# Patient Record
Sex: Male | Born: 1970 | Race: White | Hispanic: No | Marital: Married | Smoking: Never smoker
Health system: Southern US, Community
[De-identification: ages and names within clinical notes are randomized; demographics above are authoritative.]

## PROBLEM LIST (undated history)

## (undated) HISTORY — PX: APPENDECTOMY: SHX54

---

## 2008-11-15 ENCOUNTER — Emergency Department (HOSPITAL_BASED_OUTPATIENT_CLINIC_OR_DEPARTMENT_OTHER): Admission: EM | Admit: 2008-11-15 | Discharge: 2008-11-15 | Payer: Self-pay | Admitting: Emergency Medicine

## 2019-05-03 ENCOUNTER — Emergency Department (HOSPITAL_BASED_OUTPATIENT_CLINIC_OR_DEPARTMENT_OTHER)
Admission: EM | Admit: 2019-05-03 | Discharge: 2019-05-03 | Disposition: A | Discharge status: Home / Self Care | Payer: Managed Care, Other (non HMO) | Attending: Emergency Medicine | Admitting: Emergency Medicine

## 2019-05-03 ENCOUNTER — Other Ambulatory Visit: Payer: Self-pay

## 2019-05-03 ENCOUNTER — Encounter (HOSPITAL_BASED_OUTPATIENT_CLINIC_OR_DEPARTMENT_OTHER): Payer: Self-pay | Admitting: Emergency Medicine

## 2019-05-03 ENCOUNTER — Emergency Department (HOSPITAL_BASED_OUTPATIENT_CLINIC_OR_DEPARTMENT_OTHER): Payer: Managed Care, Other (non HMO)

## 2019-05-03 DIAGNOSIS — Y999 Unspecified external cause status: Secondary | ICD-10-CM | POA: Diagnosis not present

## 2019-05-03 DIAGNOSIS — T148XXA Other injury of unspecified body region, initial encounter: Secondary | ICD-10-CM

## 2019-05-03 DIAGNOSIS — Z79899 Other long term (current) drug therapy: Secondary | ICD-10-CM | POA: Diagnosis not present

## 2019-05-03 DIAGNOSIS — Y92312 Tennis court as the place of occurrence of the external cause: Secondary | ICD-10-CM | POA: Diagnosis not present

## 2019-05-03 DIAGNOSIS — Y9373 Activity, racquet and hand sports: Secondary | ICD-10-CM | POA: Insufficient documentation

## 2019-05-03 DIAGNOSIS — S76222A Laceration of adductor muscle, fascia and tendon of left thigh, initial encounter: Secondary | ICD-10-CM | POA: Diagnosis not present

## 2019-05-03 DIAGNOSIS — X509XXA Other and unspecified overexertion or strenuous movements or postures, initial encounter: Secondary | ICD-10-CM | POA: Insufficient documentation

## 2019-05-03 DIAGNOSIS — R1032 Left lower quadrant pain: Secondary | ICD-10-CM | POA: Diagnosis present

## 2019-05-03 MED ORDER — HYDROMORPHONE HCL 1 MG/ML IJ SOLN
1.0000 mg | Freq: Once | INTRAMUSCULAR | Status: AC
  Administered 2019-05-03: 1 mg via INTRAVENOUS
  Filled 2019-05-03: qty 1

## 2019-05-03 MED ORDER — TIZANIDINE HCL 4 MG PO CAPS: 4.0000 mg | ORAL_CAPSULE | Freq: Three times a day (TID) | ORAL | 0 refills | Status: AC

## 2019-05-03 MED ORDER — HYDROCODONE-ACETAMINOPHEN 5-325 MG PO TABS: 1.0000 | ORAL_TABLET | Freq: Four times a day (QID) | ORAL | 0 refills | Status: DC | PRN

## 2019-05-03 MED ORDER — TIZANIDINE HCL 4 MG PO CAPS: 4.0000 mg | ORAL_CAPSULE | Freq: Three times a day (TID) | ORAL | 0 refills | Status: DC

## 2019-05-03 MED ORDER — HYDROCODONE-ACETAMINOPHEN 5-325 MG PO TABS: 1.0000 | ORAL_TABLET | Freq: Four times a day (QID) | ORAL | 0 refills | Status: AC | PRN

## 2019-05-03 NOTE — ED Notes (Signed)
ED Provider at bedside. 

## 2019-05-03 NOTE — ED Provider Notes (Signed)
[facility] EMERGENCY DEPARTMENT Provider Note   CSN: [id number] Arrival date & time: 05/03/19  1211     History   Chief Complaint Chief Complaint  Patient presents with   Groin Pain    HPI Spencer Chambers is a 48 y.o. male.     Level 5 caveat for acuity of condition and pain.  Patient here with left groin pain after hurting himself while playing tennis.  States he was running after a ball and twisted the wrong way.  Did not fall or hit his head.  Complains of severe pain to his left groin and medial thigh.  He has a history of sciatica but this feels slightly different.  There is no numbness going down his leg.  There is some pain and weakness with attempted flexion of the hip. There is no pain with urination or blood in the urine.  Denies any abdominal pain or testicular pain.  He did not take any pain medication at home. No bowel or bladder incontinence.  No fever or vomiting.  No chest pain or shortness of breath.  The history is provided by the patient.  Groin Pain Pertinent negatives include no chest pain, no abdominal pain and no shortness of breath.    History reviewed. No pertinent past medical history.  There are no active problems to display for this patient.   Past Surgical History:  Procedure Laterality Date   APPENDECTOMY          Home Medications    Prior to Admission medications   Medication Sig Start Date End Date Taking? Authorizing Provider  traZODone (DESYREL) 150 MG tablet Take 150 mg by mouth at bedtime.   Yes [provider]    Family History History reviewed. No pertinent family history.  Social History Social History   Tobacco Use   Smoking status: Never Smoker   Smokeless tobacco: Never Used  Substance Use Topics   Alcohol use: Yes    Comment: occ   Drug use: Never     Allergies   Shellfish allergy   Review of Systems Review of Systems  HENT: Negative for congestion and rhinorrhea.     Respiratory: Positive for chest tightness. Negative for shortness of breath.   Cardiovascular: Negative for chest pain.  Gastrointestinal: Negative for abdominal pain, nausea and vomiting.  Genitourinary: Negative for dysuria, flank pain, hematuria, scrotal swelling, testicular pain and urgency.  Musculoskeletal: Positive for arthralgias and myalgias.   all other systems are negative except as noted in the HPI and PMH.     Physical Exam Updated Vital Signs BP (!) 168/98 (BP Location: Right Arm)    Pulse (!) 107    Temp 98.9 F (37.2 C) (Oral)    Resp 18    SpO2 96%   Physical Exam Vitals signs and nursing note reviewed.  Constitutional:      General: He is not in acute distress.    Appearance: He is well-developed.     Comments: Uncomfortable  HENT:     Head: Normocephalic and atraumatic.     Mouth/Throat:     Pharynx: No oropharyngeal exudate.  Eyes:     Conjunctiva/sclera: Conjunctivae normal.     Pupils: Pupils are equal, round, and reactive to light.  Neck:     Musculoskeletal: Normal range of motion and neck supple.     Comments: No meningismus. Cardiovascular:     Rate and Rhythm: Normal rate and regular rhythm.     Heart sounds: Normal heart  sounds. No murmur.  Pulmonary:     Effort: Pulmonary effort is normal. No respiratory distress.     Breath sounds: Normal breath sounds.  Abdominal:     Palpations: Abdomen is soft.     Tenderness: There is no abdominal tenderness. There is no guarding or rebound.  Genitourinary:    Comments: No testicular tenderness, no appreciable hernia.  There is tenderness and induration and spasm to the left medial thigh without fluctuance.  Intact ankle flexion and extension bilaterally.  Intact DP and PT pulses Musculoskeletal:        General: Deformity and signs of injury present. No tenderness.     Comments: Some weakness in flexing left hip. Pain with attempted movement of L hip. Firm induration across medial thigh that is very  tender. Compartments appear to be soft.  Skin:    General: Skin is warm.     Capillary Refill: Capillary refill takes less than 2 seconds.  Neurological:     General: No focal deficit present.     Mental Status: He is alert and oriented to person, place, and time. Mental status is at baseline.     Cranial Nerves: No cranial nerve deficit.     Motor: No abnormal muscle tone.     Coordination: Coordination normal.     Comments:  5/5 strength throughout. CN 2-12 intact.Equal grip strength.   Psychiatric:        Behavior: Behavior normal.      ED Treatments / Results  Labs (all labs ordered are listed, but only abnormal results are displayed) Labs Reviewed - No data to display  EKG None  Radiology Mr Hip Left Wo Contrast  Result Date: 05/03/2019 CLINICAL DATA:  Twisting injury playing tennis. Upper left thigh pain. EXAM: MR OF THE LEFT HIP WITHOUT CONTRAST TECHNIQUE: Multiplanar, multisequence MR imaging was performed. No intravenous contrast was administered. COMPARISON:  None. FINDINGS: Both hips are normally located. No hip fracture or AVN. No joint effusion. The pubic symphysis and SI joints are intact. No pelvic fractures or bone lesions. Extensive adductor muscle injuries. High-grade tearing of the pectineus and obturator externus muscles. The adductor longus tendon is completely torn from its attachment site and is retracted into the upper thigh. Diffuse edema in the muscle. The adductor brevis is also torn and may be partially stripped from its attachment site on the femur. Associated significant fluid/edema and hemorrhage extending down the medial aspect of the left thigh. No femur fracture. IMPRESSION: 1. 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 The adductor longus tendon is ruptured from its attachment site and is retracted into the upper thigh. 2. Probable partial  tearing of the adductor brevis muscle from its femoral attachment site. 3. The hips are intact and there is no pelvic or femur fractures identified. Electronically Signed   By: Marijo Sanes M.D.   On: 05/03/2019 14:53    Procedures Procedures (including critical care time)  Medications Ordered in ED Medications  HYDROmorphone (DILAUDID) injection 1 mg (1 mg Intravenous Given 05/03/19 1249)     Initial Impression / Assessment and Plan / ED Course  I have reviewed the triage vital signs and the nursing notes.  Pertinent labs & imaging results that were available during my care of the patient were reviewed by me and considered in my medical decision making (see chart for details).       Patient twisted while playing tennis and  suffered a groin injury.  He has difficulty flexing his left hip.  Distal pulses are intact.  No fall or direct trauma.  Concern for muscle strain or tear.  MRI is available and this shows torn muscles and tendons as above.  Adductor compartment injury  with multiple torn muscles and tendons.  Will discuss with orthopedics  Awaiting return phone call at shift change. Dr Jeraldine Loots to assume care . Final Clinical Impressions(s) / ED Diagnoses   Final diagnoses:  Muscle tear    ED Discharge Orders    None       Zahara Rembert, Jeannett Senior, MD 05/03/19 1659

## 2019-05-03 NOTE — ED Triage Notes (Signed)
Pt here after hurting left groin while playing tennis. States he went for a ball and twisted the wrong way and felt a tearing sensation in that area.

## 2019-05-03 NOTE — ED Notes (Signed)
Informed ED MD of pain 7/10

## 2019-05-03 NOTE — ED Notes (Signed)
Patient transported to MRI 

## 2019-05-03 NOTE — ED Provider Notes (Signed)
6:34 PM After a substantial delay in obtaining orthopedic consult, another orthopedic service, Dr. Ninfa Linden was happy to assist with evaluation of the patient's MRI results.  This was appreciated, and we discussed the patient's findings, which were subsequently made to the patient.  I with concern for multiple traumatic injuries to musculature, tendons, the patient will require outpatient follow-up, but does not require admission, nor emergent surgery. Patient amenable to this, amenable to discharge with anti-inflammatories, ice, muscle relaxants, outpatient follow-up.    Carmin Muskrat, MD 05/03/19 [phone]

## 2019-05-03 NOTE — Discharge Instructions (Addendum)
As discussed, today's evaluation has demonstrated injury to multiple muscles and tendon in your hip. The MRI interpretation is included below.  Our colleague who is a sports medicine specialist, is aware of your situation, and please be sure to schedule follow-up with his office this week. Monitor your condition carefully, and return here for concerning changes. For pain control, please use ibuprofen, 400 mg, 3 times daily, ice packs, and the prescribed muscle relaxers and pain medication for breakthrough discomfort.  Do not take the Norco and Zanaflex at the same time.

## 2019-05-05 ENCOUNTER — Ambulatory Visit (INDEPENDENT_AMBULATORY_CARE_PROVIDER_SITE_OTHER): Payer: Managed Care, Other (non HMO) | Admitting: Orthopedic Surgery

## 2019-05-05 ENCOUNTER — Other Ambulatory Visit: Payer: Self-pay

## 2019-05-05 DIAGNOSIS — S76202A Unspecified injury of adductor muscle, fascia and tendon of left thigh, initial encounter: Secondary | ICD-10-CM

## 2019-05-07 ENCOUNTER — Encounter: Payer: Self-pay | Admitting: Orthopedic Surgery

## 2019-05-07 NOTE — Progress Notes (Signed)
   Office Visit Note   Patient: Spencer Chambers           Date of Birth: 08/19/1970           MRN: [id number] Visit Date: 05/05/2019 Requested by: [facility] [place],  [place] [id number] PCP: [facility]  Subjective: Chief Complaint  Patient presents with  . 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  He was playing tennis and he felt a tearing sensation in the medial aspect of his left hip.  He reports groin and medial thigh pain.  Describes bruising and swelling.  Painful for him to lift his leg.  He has been taking ibuprofen and using ice.  He has had an MRI scan of that left hip which is reviewed.  It does show tearing of the abductor longus and part of the pectineus and obturator.  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              ROS: All systems reviewed are negative as they relate to the chief complaint within the history of present illness.  Patient denies  fevers or chills.   Assessment & Plan: Visit Diagnoses:  1. Injury of adductor muscle and tendon of left thigh, initial encounter     Plan: Impression is abductor tearing with some weakness in adduction.  Reviewed the literature on this and and in general nonoperative treatment is the way to go.  I will start him in some physical therapy 2 times a week for 4 weeks for modalities and rehab.  Sedentary duty for 2 weeks and then okay to return to supervisory work.  4-week return for clinical recheck.  Follow-Up Instructions: Return in about 4 weeks (around 06/02/2019).   Orders:  Orders Placed This Encounter  Procedures  . Ambulatory referral to Physical Therapy   No orders of the defined types were placed in this encounter.     Procedures: No procedures performed   Clinical Data: No additional findings.  Objective: Vital Signs: There were no vitals taken for this visit.  Physical Exam:   Constitutional:  Patient appears well-developed HEENT:  Head: Normocephalic Eyes:EOM are normal Neck: Normal range of motion Cardiovascular: Normal rate Pulmonary/chest: Effort normal Neurologic: Patient is alert Skin: Skin is warm Psychiatric: Patient has normal mood and affect    Ortho Exam: Ortho exam demonstrates some bruising and ecchymosis along the medial aspect of the left thigh.  Mild groin pain with internal extra rotation of the leg but good hip abduction strength and weakness in adduction on the left.  He also has pretty good hip flexion strength bilaterally.  Specialty Comments:  No specialty comments available.  Imaging: No results found.   PMFS History: There are no active problems to display for this patient.  History reviewed. No pertinent past medical history.  History reviewed. No pertinent family history.  Past Surgical History:  Procedure Laterality Date  . APPENDECTOMY     Social History   Occupational History  . Not on file  Tobacco Use  . Smoking status: Never Smoker  . Smokeless tobacco: Never Used  Substance and Sexual Activity  . Alcohol use: Yes    Comment: occ  . Drug use: Never  . Sexual activity: Yes

## 2019-05-19 ENCOUNTER — Other Ambulatory Visit: Payer: Self-pay

## 2019-05-19 ENCOUNTER — Ambulatory Visit: Payer: Managed Care, Other (non HMO) | Attending: Orthopedic Surgery | Admitting: Physical Therapy

## 2019-05-19 ENCOUNTER — Ambulatory Visit: Payer: Managed Care, Other (non HMO) | Admitting: Physical Therapy

## 2019-05-19 DIAGNOSIS — M62838 Other muscle spasm: Secondary | ICD-10-CM

## 2019-05-19 DIAGNOSIS — R262 Difficulty in walking, not elsewhere classified: Secondary | ICD-10-CM | POA: Diagnosis present

## 2019-05-19 DIAGNOSIS — M6281 Muscle weakness (generalized): Secondary | ICD-10-CM | POA: Diagnosis present

## 2019-05-19 DIAGNOSIS — M79652 Pain in left thigh: Secondary | ICD-10-CM

## 2019-05-19 NOTE — Therapy (Signed)
[facility] [address]  [address] [place], [place], [address] Phone: [phone]   Fax:  [phone]  Physical Therapy Evaluation  Patient Details  Name: Spencer Chambers MRN: [id number] Date of Birth: Jun 25, 1970 Referring Provider (PT): Burnard Bunting, MD   Encounter Date: 05/19/2019  PT End of Session - 05/19/19 0926    Visit Number  1    Number of Visits  8    Date for PT Re-Evaluation  06/16/19    Authorization Type  [employer] - VL: 20  ($60 copay)    PT Start Time  0926    PT Stop Time  1018    PT Time Calculation (min)  52 min    Activity Tolerance  Patient tolerated treatment well    Behavior During Therapy  [facility] for tasks assessed/performed       No past medical history on file.  Past Surgical History:  Procedure Laterality Date  . APPENDECTOMY      There were no vitals filed for this visit.   Subjective Assessment - 05/19/19 0930    Subjective  16 days ago was playing tennis with son when he felt a ripping sound in his L groin. MRI revealed multiple tears in L hip adductor muscles.    Limitations  Standing;Walking    How long can you stand comfortably?  5 minutes    How long can you walk comfortably?  <20 minutes    Diagnostic tests  L hip MRI 05/03/19: 1. 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 The adductor longus tendon is ruptured from its attachment site and is retracted into the upper thigh.  2. Probable partial tearing of the adductor brevis muscle from its femoral attachment site.  3. The hips are intact and there is no pelvic or femur fractures identified.    Patient Stated Goals  "get ROM back w/o pain"    Currently in Pain?  Yes    Pain Score  3     Pain Location  Groin    Pain Orientation  Left    Pain Descriptors / Indicators  Sharp;Dull    Pain Type  Acute pain    Pain Radiating  Towards  L medial thigh    Pain Onset  1 to 4 weeks ago    Pain Frequency  Constant    Aggravating Factors   supine to sit, trunk extension    Pain Relieving Factors  ice, ibuprofen    Effect of Pain on Daily Activities  pain with transitions in/out of bed         [facility] PT Assessment - 05/19/19 0926      Assessment   Medical Diagnosis  L hip adductor injury    Referring Provider (PT)  G. Dorene Grebe, MD    Onset Date/Surgical Date  05/03/19    Next MD Visit  06/04/19    Prior Therapy  none      Precautions   Precautions  None      Balance Screen   Has the patient fallen in the past 6 months  No    Has the patient had a decrease in activity level because of a fear of falling?   No    Is the patient reluctant to leave their home because of a fear of falling?   No      Home Environment   Living Environment  Private residence    Type of [place] to enter    Entrance Stairs-Number of Steps  2-3    [place]  One level      Prior Function   Level of Independence  Independent    Vocation  Full time employment    Stage manager    Leisure  tennis, yardwork, running, hiking      Cognition   Overall Cognitive Status  Within Functional Limits for tasks assessed      Observation/Other Assessments   Focus on Therapeutic Outcomes (FOTO)   Upper leg - 56% (44% limitation); Predicted 77% (23% limitation)      ROM / Strength   AROM / PROM / Strength  AROM;PROM;Strength      AROM   Overall AROM   Deficits;Due to pain    Overall AROM Comments  mildly limited L hip AROM      PROM   Overall PROM   Deficits;Due to pain   & tightness   Overall PROM Comments  L hip WFL with mild restrictions in abduction and ER/IR      Strength   Strength Assessment Site  Hip;Knee    Right/Left Hip  Right;Left    Right Hip Flexion  5/5    Right Hip Extension  5/5    Right Hip External Rotation   5/5    Right Hip Internal Rotation  5/5    Right  Hip ABduction  5/5    Right Hip ADduction  5/5    Left Hip Flexion  4/5   mild pain   Left Hip Extension  4+/5    Left Hip External Rotation  4/5    Left Hip Internal Rotation  4/5   mild pain   Left Hip ABduction  4+/5    Left Hip ADduction  3/5    Right/Left Knee  Right;Left    Right Knee Flexion  5/5    Right Knee Extension  5/5    Left Knee Flexion  4/5    Left Knee Extension  4/5      Flexibility   Soft Tissue Assessment /Muscle Length  yes    Hamstrings  mild tight L>R    Quadriceps  mild tight L>R    ITB  mild tight L>R    Piriformis  mild/mod tight L>R                Objective measurements completed on examination: See above findings.      [facility] PT Treatment/Exercise - 05/19/19 0926      Exercises   Exercises  Knee/Hip      Knee/Hip Exercises: Stretches   Passive Hamstring Stretch  Left;30 seconds;1 rep    Passive Hamstring Stretch Limitations  supine with strap     Piriformis Stretch  Left;30 seconds;1 rep    Piriformis Stretch Limitations  KTOS     Other Knee/Hip Stretches  L SKTC stretch x 30 sec    Other Knee/Hip Stretches  L hip adductor stretches - supine butterfly, supine with strap & seated lateral lunge position x 30 sec each - pt preferring latter 2 stretches             PT Education - 05/19/19 1018    Education Details  PT eval findings, anticipated POC & initial HEP    Person(s) Educated  Patient    Methods  Explanation;Demonstration;Handout    Comprehension  Verbalized  understanding;Returned demonstration;Need further instruction          PT Long Term Goals - 05/19/19 1018      PT LONG TERM GOAL #1   Title  Patient will be independent with ongoing/advanced HEP    Status  New    Target Date  06/16/19      PT LONG TERM GOAL #2   Title  L hip AROM WNL w/o pain provocation    Status  New    Target Date  06/16/19      PT LONG TERM GOAL #3   Title  Overall L hip strength >/= 4+/5 with adductor strength at least  4/5 for improved stability    Status  New    Target Date  06/16/19      PT LONG TERM GOAL #4   Title  Patient to report ability to perform ADLs, household and work-related tasks without increased pain    Status  New    Target Date  06/16/19             Plan - 05/19/19 1018    Clinical Impression Statement  Spencer Chambers is a 48 y/o male who presents to OP PT for a L hip adductor injury sustained while playing tennis with his son on 05/03/19. Extensive bruising still present throughout L LE along medial thigh, posterior knee and into lower leg. MRI revealing extensive adductor compartment muscle injury with high-grade tearing of the pectineus, obturator externus, adductor longus (tendon ruptured from its attachment site and is retracted into the upper thigh) and adductor brevis muscles (probable partial tearing of the adductor brevis muscle from its femoral attachment site) although no pelvic or femur fractures identified. Assessment revealing mild limitations in L hip ROM and B proximal LE flexibility with increased muscle tension and ttp throughout L hip adductors, flexors and deep hip rotators. Mild decreased strength noted in L hip and knee with more pronounce weakness in L hip adductors. Spencer Chambers will benefit from skilled PT to address above deficits to allow for increased ease of walking with decreased limitation due to R hip pain.    Personal Factors and Comorbidities  Profession    Examination-Activity Limitations  Bed Mobility;Bend;Lift;Locomotion Level;Squat;Stand;Transfers    Examination-Participation Restrictions  Community Activity    Stability/Clinical Decision Making  Stable/Uncomplicated    Clinical Decision Making  Low    Rehab Potential  Good    PT Frequency  2x / week    PT Duration  4 weeks    PT Treatment/Interventions  ADLs/Self Care Home Management;Cryotherapy;Electrical Stimulation;Iontophoresis 4mg /ml Dexamethasone;Moist Heat;Ultrasound;Gait training;Stair training;Functional  mobility training;Therapeutic activities;Therapeutic exercise;Balance training;Neuromuscular re-education;Patient/family education;Manual techniques;Scar mobilization;Passive range of motion;Dry needling;Taping;Vasopneumatic Device    PT Next Visit Plan  Review initial HEP; gentle hip stretching and strengthening; manual therapy as indicated; modalities PRN    PT Home Exercise Plan  05/19/19 - HS, ITB, piriformis, SKTC & hip adductor stretches    Consulted and Agree with Plan of Care  Patient       Patient will benefit from skilled therapeutic intervention in order to improve the following deficits and impairments:  Abnormal gait, Decreased activity tolerance, Decreased endurance, Decreased mobility, Decreased range of motion, Decreased strength, Difficulty walking, Increased edema, Increased muscle spasms, Impaired perceived functional ability, Impaired flexibility, Pain  Visit Diagnosis: Pain in left thigh  Muscle weakness (generalized)  Other muscle spasm  Difficulty in walking, not elsewhere classified     Problem List There are no active problems to display for this patient.  Marry Guan, PT, MPT 05/19/2019, 1:51 PM  [facility] [address]  [address] [place], [place], [address] Phone: [phone]   Fax:  [phone]  Name: Spencer Chambers MRN: [id number] Date of Birth: February 14, 1971

## 2019-05-19 NOTE — Patient Instructions (Signed)
    Home exercise program created by Deago Burruss, PT.  For questions, please contact Dessirae Scarola via phone at [phone] or email at [email or web address]  [facility] [address]  [address] [place], [place], [address] Phone: [phone]   Fax:  [phone]    

## 2019-05-21 ENCOUNTER — Ambulatory Visit: Payer: Managed Care, Other (non HMO) | Attending: Orthopedic Surgery | Admitting: Physical Therapy

## 2019-05-21 ENCOUNTER — Encounter: Payer: Self-pay | Admitting: Physical Therapy

## 2019-05-21 ENCOUNTER — Other Ambulatory Visit: Payer: Self-pay

## 2019-05-21 DIAGNOSIS — M6281 Muscle weakness (generalized): Secondary | ICD-10-CM | POA: Insufficient documentation

## 2019-05-21 DIAGNOSIS — M62838 Other muscle spasm: Secondary | ICD-10-CM | POA: Insufficient documentation

## 2019-05-21 DIAGNOSIS — M79652 Pain in left thigh: Secondary | ICD-10-CM | POA: Diagnosis present

## 2019-05-21 DIAGNOSIS — R262 Difficulty in walking, not elsewhere classified: Secondary | ICD-10-CM | POA: Diagnosis present

## 2019-05-21 NOTE — Therapy (Signed)
[facility] [address]  [address] [place], [place], [address] Phone: [phone]   Fax:  [phone]  Physical Therapy Treatment  Patient Details  Name: Spencer Chambers MRN: [id number] Date of Birth: 07-07-1970 Referring Provider (PT): Burnard Bunting, MD   Encounter Date: 05/21/2019  PT End of Session - 05/21/19 0928    Visit Number  2    Number of Visits  8    Date for PT Re-Evaluation  06/16/19    Authorization Type  [employer] - VL: 20  ($60 copay)    PT Start Time  0928    PT Stop Time  1013    PT Time Calculation (min)  45 min    Activity Tolerance  Patient tolerated treatment well    Behavior During Therapy  [facility] for tasks assessed/performed       History reviewed. No pertinent past medical history.  Past Surgical History:  Procedure Laterality Date  . APPENDECTOMY      There were no vitals filed for this visit.  Subjective Assessment - 05/21/19 0932    Subjective  Pt reporting good tolerance for home stretches with pain now lessening and no pain with normal walking.    Limitations  Standing;Walking    How long can you stand comfortably?  5 minutes    How long can you walk comfortably?  <20 minutes    Diagnostic tests  L hip MRI 05/03/19: 1. 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 The adductor longus tendon is ruptured from its attachment site and is retracted into the upper thigh.  2. Probable partial tearing of the adductor brevis muscle from its femoral attachment site.  3. The hips are intact and there is no pelvic or femur fractures identified.    Patient Stated Goals  "get ROM back w/o pain"    Currently in Pain?  No/denies    Pain Score  0-No pain    Pain Location  Groin    Pain Orientation  Left    Pain Frequency  Intermittent                       [facility] Adult PT  Treatment/Exercise - 05/21/19 0928      Exercises   Exercises  Knee/Hip      Knee/Hip Exercises: Stretches   Gastroc Stretch  Left;30 seconds;2 reps    Gastroc Stretch Limitations  runner's stretch at wall & negative heel off edge of step - pt reporting better stretch with latter    Soleus Stretch  Left;30 seconds;1 rep    Soleus Stretch Limitations  runner's stretch at wall       Knee/Hip Exercises: Aerobic   Recumbent Bike  L1 x 6 min      Knee/Hip Exercises: Supine   Hip Adduction Isometric  Both;10 reps;Strengthening;2 sets    Hip Adduction Isometric Limitations  gentle ball squeeze    Bridges  Both;10 reps;Strengthening   5" hold   Straight Leg Raises  Left;10 reps;Strengthening   3" hold   Straight Leg Raises Limitations  leg starting from slight hip flexion (lower leg resting on bolster) due to increased pain with initiation from neutral     Other Supine Knee/Hip Exercises  Hooklying L knee fall-out x 10 avoiding painful ROM      Manual Therapy   Manual Therapy  Soft tissue mobilization;Myofascial release;Other (comment)  Manual therapy comments  slight hooklying with L LE slightly ER and resting on PT's knee    Soft tissue mobilization  STM/DTM to L hip adductors, distal iliopsoas and proximal iliacus - very ttp over proximal L hip adductors    Myofascial Release  manual TPR and pin & stretch to L proximal hip adductors - better tolerance for exercise following this    Other Manual Therapy  Provided instruction in self-release/STM for L hip adductors and flexors as needed at home.             PT Education - 05/21/19 1013    Education Details  HEP update - gentle hip adductor isometric, bent knee fall-out, bridge + hip adduction isometric; instruction in self-STM/release for L hip adductors & flexors    Person(s) Educated  Patient    Methods  Explanation;Demonstration;Handout    Comprehension  Verbalized understanding;Returned demonstration;Need further instruction           PT Long Term Goals - 05/21/19 0935      PT LONG TERM GOAL #1   Title  Patient will be independent with ongoing/advanced HEP    Status  On-going    Target Date  06/16/19      PT LONG TERM GOAL #2   Title  L hip AROM WNL w/o pain provocation    Status  On-going    Target Date  06/16/19      PT LONG TERM GOAL #3   Title  Overall L hip strength >/= 4+/5 with adductor strength at least 4/5 for improved stability    Status  On-going    Target Date  06/16/19      PT LONG TERM GOAL #4   Title  Patient to report ability to perform ADLs, household and work-related tasks without increased pain    Status  On-going    Target Date  06/16/19            Plan - 05/21/19 0936    Clinical Impression Statement  Spencer Chambers reporting good tolerance for home stretches with decreasing pain and improving walking tolerance - greatest benefit from adductor stretches. He denies need for review of HEP today, therefore introduced gentle hip abd/adduction ROM and isometric strengthening. Tolerance initially limited with increased pain reported in L hip adductors, however improved after STM/DTM, MFR and manual TPR. Difficulty initiating SLR from neutral hip flexion/extension, but better tolerated when initiated from slightly elevation in flexion with foot resting on bolster. HEP updated to include gentle ROM and isometric strengthening with patient cautioned to work within pain tolerance. Spencer Chambers declining need for ice for pain at end of session and will use ice and/or heat PRN at home.    Personal Factors and Comorbidities  Profession    Examination-Activity Limitations  Bed Mobility;Bend;Lift;Locomotion Level;Squat;Stand;Transfers    Examination-Participation Restrictions  Community Activity    Rehab Potential  Good    PT Frequency  2x / week    PT Duration  4 weeks    PT Treatment/Interventions  ADLs/Self Care Home Management;Cryotherapy;Electrical Stimulation;Iontophoresis 4mg /ml  Dexamethasone;Moist Heat;Ultrasound;Gait training;Stair training;Functional mobility training;Therapeutic activities;Therapeutic exercise;Balance training;Neuromuscular re-education;Patient/family education;Manual techniques;Scar mobilization;Passive range of motion;Dry needling;Taping;Vasopneumatic Device    PT Next Visit Plan  review HEP(s) as indicated; gentle hip stretching and strengthening; manual therapy as indicated; modalities PRN    PT Home Exercise Plan  05/19/19 - HS, ITB, piriformis, SKTC & hip adductor stretches; 05/21/19 - gentle hip adductor isometric, bent knee fall-out & bridge + hip adduction isometric, instruction in  self-STM/release for L hip adductors & flexors    Consulted and Agree with Plan of Care  Patient       Patient will benefit from skilled therapeutic intervention in order to improve the following deficits and impairments:  Abnormal gait, Decreased activity tolerance, Decreased endurance, Decreased mobility, Decreased range of motion, Decreased strength, Difficulty walking, Increased edema, Increased muscle spasms, Impaired perceived functional ability, Impaired flexibility, Pain  Visit Diagnosis: Pain in left thigh  Muscle weakness (generalized)  Other muscle spasm  Difficulty in walking, not elsewhere classified     Problem List There are no active problems to display for this patient.   Marry GuanJoAnne M Kreis, PT, MPT 05/21/2019, 11:40 AM  [facility] [address]  [address] [place], [place], [address] Phone: [phone]   Fax:  [phone]  Name: Janyce Llanoseter Driggers MRN: [id number] Date of Birth: [birth year]

## 2019-05-26 ENCOUNTER — Ambulatory Visit: Payer: Managed Care, Other (non HMO)

## 2019-05-26 ENCOUNTER — Other Ambulatory Visit: Payer: Self-pay

## 2019-05-26 DIAGNOSIS — M79652 Pain in left thigh: Secondary | ICD-10-CM

## 2019-05-26 DIAGNOSIS — M6281 Muscle weakness (generalized): Secondary | ICD-10-CM

## 2019-05-26 DIAGNOSIS — M62838 Other muscle spasm: Secondary | ICD-10-CM

## 2019-05-26 DIAGNOSIS — R262 Difficulty in walking, not elsewhere classified: Secondary | ICD-10-CM

## 2019-05-26 NOTE — Therapy (Signed)
[facility] [address]  [address] [place], [place], [address] Phone: [phone]   Fax:  [phone]  Physical Therapy Treatment  Patient Details  Name: Spencer Chambers Isidore MRN: [id number] Date of Birth: 1971-04-02 Referring Provider (PT): Burnard BuntingG. Scott Dean, MD   Encounter Date: 05/26/2019  PT End of Session - 05/26/19 1032    Visit Number  3    Number of Visits  8    Date for PT Re-Evaluation  06/16/19    Authorization Type  [employer] - VL: 20  ($60 copay)    PT Start Time  1023    PT Stop Time  1102    PT Time Calculation (min)  39 min    Activity Tolerance  Patient tolerated treatment well    Behavior During Therapy  [facility] for [facility] assessed/performed       No past medical history on file.  Past Surgical History:  Procedure Laterality Date  . APPENDECTOMY      There were no vitals filed for this visit.  Subjective Assessment - 05/26/19 1031    Subjective  Pt. reporting he has been using heat pad at home with some relief.    Diagnostic tests  L hip MRI 05/03/19: 1. 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 The adductor longus tendon is ruptured from its attachment site and is retracted into the upper thigh.  2. Probable partial tearing of the adductor brevis muscle from its femoral attachment site.  3. The hips are intact and there is no pelvic or femur fractures identified.    Patient Stated Goals  "get ROM back w/o pain"    Currently in Pain?  No/denies    Pain Score  0-No pain   3/10 at worst   Pain Orientation  Left    Pain Descriptors / Indicators  Dull    Pain Type  Acute pain    Pain Onset  1 to 4 weeks ago    Pain Frequency  Intermittent    Multiple Pain Sites  No                       [facility] Adult PT Treatment/Exercise - 05/26/19 0001      Knee/Hip Exercises: Stretches   Passive  Hamstring Stretch  Left;30 seconds;1 rep    Passive Hamstring Stretch Limitations  supine with strap     Hip Flexor Stretch  Left;30 seconds;1 rep    Hip Flexor Stretch Limitations  mod thomas position + mild groin stretch     Piriformis Stretch  Left;30 seconds;2 reps    Piriformis Stretch Limitations  KTOS;  figure-4 stretch     Other Knee/Hip Stretches  L adductor stretch in varying positions of hip flexion/ext, and L knee bent and extended x 20 sec each way       Knee/Hip Exercises: Aerobic   Recumbent Bike  L1 x 7 min      Knee/Hip Exercises: Supine   Hip Adduction Isometric  Both;15 reps;1 set;Strengthening    Hip Adduction Isometric Limitations  gentle ball squeeze    Bridges  Both;15 reps    Other Supine Knee/Hip Exercises  Hooklying L knee fall-out x 15 avoiding painful ROM      Manual Therapy   Manual Therapy  Soft tissue mobilization;Myofascial release    Manual therapy comments  slight hooklying     Soft tissue mobilization  STM/DTM to L hip adductors, distal iliopsoas - min tenderness at most in mid adductor group    Myofascial Release  manual TPR and pin & stretch to L proximal hip adductors - better tolerance for exercise following this                  PT Long Term Goals - 05/21/19 0935      PT LONG TERM GOAL #1   Title  Patient will be independent with ongoing/advanced HEP    Status  On-going    Target Date  06/16/19      PT LONG TERM GOAL #2   Title  L hip AROM WNL w/o pain provocation    Status  On-going    Target Date  06/16/19      PT LONG TERM GOAL #3   Title  Overall L hip strength >/= 4+/5 with adductor strength at least 4/5 for improved stability    Status  On-going    Target Date  06/16/19      PT LONG TERM GOAL #4   Title  Patient to report ability to perform ADLs, household and work-related tasks without increased pain    Status  On-going    Target Date  06/16/19            Plan - 05/26/19 1041    Clinical Impression  Statement  Saifan doing well.  Denies soreness after last session.  Has been performing updated HEP without pain.  Able to demo good performance of updated HEP with cueing x 1 for full hip extension with bridge/adduction.  STM/DTM addressing palpable tightness in L mid/proximal adductor group with pt. tolerating well.  Able to progress repetitions with gentle proximal hip strengthening activities without increased pain today.  Pt. denied significant ttp in L adductor group today with MT.  Pt. inquiring about starting to jog and encourage to hold off on this activity for time being as to not over strain L adductors.  Pt. verbalized understanding.    Personal Factors and Comorbidities  Profession    Rehab Potential  Good    PT Treatment/Interventions  ADLs/Self Care Home Management;Cryotherapy;Electrical Stimulation;Iontophoresis 4mg /ml Dexamethasone;Moist Heat;Ultrasound;Gait training;Stair training;Functional mobility training;Therapeutic activities;Therapeutic exercise;Balance training;Neuromuscular re-education;Patient/family education;Manual techniques;Scar mobilization;Passive range of motion;Dry needling;Taping;Vasopneumatic Device    PT Next Visit Plan  review HEP(s) as indicated; gentle hip stretching and strengthening; manual therapy as indicated; modalities PRN    PT Home Exercise Plan  05/19/19 - HS, ITB, piriformis, SKTC & hip adductor stretches; 05/21/19 - gentle hip adductor isometric, bent knee fall-out & bridge + hip adduction isometric, instruction in self-STM/release for L hip adductors & flexors    Consulted and Agree with Plan of Care  Patient       Patient will benefit from skilled therapeutic intervention in order to improve the following deficits and impairments:  Abnormal gait, Decreased activity tolerance, Decreased endurance, Decreased mobility, Decreased range of motion, Decreased strength, Difficulty walking, Increased edema, Increased muscle spasms, Impaired perceived functional  ability, Impaired flexibility, Pain  Visit Diagnosis: Pain in left thigh  Muscle weakness (generalized)  Other muscle spasm  Difficulty in walking, not elsewhere classified     Problem List There are no active problems to display for this patient.   14/2/20, PTA 05/26/19 12:18 PM   [facility] [address]  [address] [place], [place], [place] Phone: [phone]   Fax:  [phone]  Name: Jay Haskew MRN: Spencer Llanos Date  of Birth: 20-Oct-1970

## 2019-05-29 ENCOUNTER — Other Ambulatory Visit: Payer: Self-pay

## 2019-05-29 ENCOUNTER — Ambulatory Visit: Payer: Managed Care, Other (non HMO)

## 2019-05-29 DIAGNOSIS — M6281 Muscle weakness (generalized): Secondary | ICD-10-CM

## 2019-05-29 DIAGNOSIS — R262 Difficulty in walking, not elsewhere classified: Secondary | ICD-10-CM

## 2019-05-29 DIAGNOSIS — M79652 Pain in left thigh: Secondary | ICD-10-CM | POA: Diagnosis not present

## 2019-05-29 DIAGNOSIS — M62838 Other muscle spasm: Secondary | ICD-10-CM

## 2019-05-29 NOTE — Therapy (Signed)
[facility] [place] [facility] [address]  [place], [place], [address] Phone: [phone]   Fax:  [phone]  Physical Therapy Treatment  Patient Details  Name: Spencer Chambers MRN: [id number] Date of Birth: [birth year] Referring Provider (PT): Anderson Malta, MD   Encounter Date: 05/29/2019  PT End of Session - 05/29/19 0937    Visit Number  4    Number of Visits  8    Date for PT Re-Evaluation  06/16/19    Authorization Type  [employer] - VL: 20  ($60 copay)    PT Start Time  0932    PT Stop Time  1012    PT Time Calculation (min)  40 min    Activity Tolerance  Patient tolerated treatment well    Behavior During Therapy  [facility] for tasks assessed/performed       History reviewed. No pertinent past medical history.  Past Surgical History:  Procedure Laterality Date  . APPENDECTOMY      There were no vitals filed for this visit.  Subjective Assessment - 05/29/19 0937    Subjective  Pt. denies soreness after last visit.    Diagnostic tests  L hip MRI 05/03/19: 1. 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 The adductor longus tendon is ruptured from its attachment site and is retracted into the upper thigh.  2. Probable partial tearing of the adductor brevis muscle from its femoral attachment site.  3. The hips are intact and there is no pelvic or femur fractures identified.    Patient Stated Goals  "get ROM back w/o pain"    Currently in Pain?  No/denies    Pain Score  0-No pain    Multiple Pain Sites  No                       [facility] Adult PT Treatment/Exercise - 05/29/19 0001      Knee/Hip Exercises: Stretches   Passive Hamstring Stretch  Left;30 seconds;1 rep    Passive Hamstring Stretch Limitations  supine with strap     Hip Flexor Stretch  Left;30 seconds;1 rep    Hip Flexor Stretch Limitations  mod  thomas position + mild groin stretch     Piriformis Stretch  Left;30 seconds;2 reps    Piriformis Stretch Limitations  Figure-4 - supine     Other Knee/Hip Stretches  L adductor stretch in varying positions of hip flexion/ext, and L knee bent and extended x 20 sec each way       Knee/Hip Exercises: Aerobic   Recumbent Bike  L1 x 6 min      Knee/Hip Exercises: Machines for Strengthening   Cybex Leg Press  B LEs: 20# x 20 reps;  Green looped TB at knees       Knee/Hip Exercises: Standing   Functional Squat  10 reps;3 seconds    Functional Squat Limitations  + adduction ball squeeze       Knee/Hip Exercises: Seated   Long Arc Quad  Left;Strengthening;2 sets   2 sets of 30 sec ball squeeze + LAQ    Long Arc Quad Limitations  + adduction ball squeeze       Knee/Hip Exercises: Sidelying   Clams  R sidelying clam shell 2 x 10 rpes              PT Education - 05/29/19 1029  Education Details  HEP update;  side stepping with red looped TB issued to pt., clam shell with red TB sidelying    Person(s) Educated  Patient    Methods  Explanation;Demonstration;Verbal cues;Handout          PT Long Term Goals - 05/21/19 0935      PT LONG TERM GOAL #1   Title  Patient will be independent with ongoing/advanced HEP    Status  On-going    Target Date  06/16/19      PT LONG TERM GOAL #2   Title  L hip AROM WNL w/o pain provocation    Status  On-going    Target Date  06/16/19      PT LONG TERM GOAL #3   Title  Overall L hip strength >/= 4+/5 with adductor strength at least 4/5 for improved stability    Status  On-going    Target Date  06/16/19      PT LONG TERM GOAL #4   Title  Patient to report ability to perform ADLs, household and work-related tasks without increased pain    Status  On-going    Target Date  06/16/19            Plan - 05/29/19 1610    Clinical Impression Statement  Pt. noting he is walking 5x/week 2-3 miles at a time.  Has been able to avoid recent  groin pain just with a low-level discomfort at times, "letting me know its there".  Tolerated progression of L hip ER, gentle groin strengthening well today without pain.  Added sidelying clam shell with red TB issued to pt. for HEP, side stepping with band, and initiated isometric hip add/counter squat.  Pt. Ended visit with discomfort unchanged from baseline and HEP updated with handout issued to him.  Pt. instructed to continue avoiding any painful activity that would cause undue strain on groin.  Pt. verbalized understanding.    Rehab Potential  Good    PT Treatment/Interventions  ADLs/Self Care Home Management;Cryotherapy;Electrical Stimulation;Iontophoresis 4mg /ml Dexamethasone;Moist Heat;Ultrasound;Gait training;Stair training;Functional mobility training;Therapeutic activities;Therapeutic exercise;Balance training;Neuromuscular re-education;Patient/family education;Manual techniques;Scar mobilization;Passive range of motion;Dry needling;Taping;Vasopneumatic Device    PT Next Visit Plan  review HEP(s) as indicated; gentle hip stretching and strengthening; manual therapy as indicated; modalities PRN    PT Home Exercise Plan  05/19/19 - HS, ITB, piriformis, SKTC & hip adductor stretches; 05/21/19 - gentle hip adductor isometric, bent knee fall-out & bridge + hip adduction isometric, instruction in self-STM/release for L hip adductors & flexors;  05/29/19 - side stepping with red TB at forefoot, clams shell with red band at knees    Consulted and Agree with Plan of Care  Patient       Patient will benefit from skilled therapeutic intervention in order to improve the following deficits and impairments:  Abnormal gait, Decreased activity tolerance, Decreased endurance, Decreased mobility, Decreased range of motion, Decreased strength, Difficulty walking, Increased edema, Increased muscle spasms, Impaired perceived functional ability, Impaired flexibility, Pain  Visit Diagnosis: Pain in left  thigh  Muscle weakness (generalized)  Other muscle spasm  Difficulty in walking, not elsewhere classified     Problem List There are no problems to display for this patient.   14/10/20, PTA 05/29/19 1:08 PM   [facility] [address]  [address] [place], [place], [place] Phone: [phone]   Fax:  [phone]  Name: Mont Jagoda MRN: Janyce Llanos Date of Birth: 05-09-1971

## 2019-06-03 ENCOUNTER — Encounter: Payer: Managed Care, Other (non HMO) | Admitting: Physical Therapy

## 2019-06-04 ENCOUNTER — Ambulatory Visit: Payer: Managed Care, Other (non HMO) | Admitting: Orthopedic Surgery

## 2019-06-05 ENCOUNTER — Ambulatory Visit: Payer: Managed Care, Other (non HMO)

## 2019-06-10 ENCOUNTER — Encounter: Payer: Self-pay | Admitting: Physical Therapy

## 2019-06-10 ENCOUNTER — Ambulatory Visit: Payer: Managed Care, Other (non HMO) | Admitting: Physical Therapy

## 2019-06-10 ENCOUNTER — Other Ambulatory Visit: Payer: Self-pay

## 2019-06-10 DIAGNOSIS — M62838 Other muscle spasm: Secondary | ICD-10-CM

## 2019-06-10 DIAGNOSIS — R262 Difficulty in walking, not elsewhere classified: Secondary | ICD-10-CM

## 2019-06-10 DIAGNOSIS — M6281 Muscle weakness (generalized): Secondary | ICD-10-CM

## 2019-06-10 DIAGNOSIS — M79652 Pain in left thigh: Secondary | ICD-10-CM

## 2019-06-10 NOTE — Therapy (Signed)
[facility] [place] [address]  [place], [place], [address] Phone: [phone]   Fax:  [phone]  Physical Therapy Treatment  Patient Details  Name: Spencer Chambers MRN: [id number] Date of Birth: Mar 22, 1971 Referring Provider (PT): Anderson Malta, MD   Encounter Date: 06/10/2019  PT End of Session - 06/10/19 0931    Visit Number  5    Number of Visits  8    Date for PT Re-Evaluation  06/16/19    Authorization Type  [employer] - VL: 20  ($60 copay)    PT Start Time  0931    PT Stop Time  1013    PT Time Calculation (min)  42 min    Activity Tolerance  Patient tolerated treatment well    Behavior During Therapy  [facility] for tasks assessed/performed       History reviewed. No pertinent past medical history.  Past Surgical History:  Procedure Laterality Date  . APPENDECTOMY      There were no vitals filed for this visit.  Subjective Assessment - 06/10/19 0934    Subjective  Pt reporting bruising and swelling is nearly gone now. Reports he was able to play tennis with compression sleeve - was very cautious and did not play aggressively but did not have any pain.    Diagnostic tests  L hip MRI 05/03/19: 1. 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 The adductor longus tendon is ruptured from its attachment site and is retracted into the upper thigh.  2. Probable partial tearing of the adductor brevis muscle from its femoral attachment site.  3. The hips are intact and there is no pelvic or femur fractures identified.    Patient Stated Goals  "get ROM back w/o pain"    Currently in Pain?  No/denies                       [facility] Adult PT Treatment/Exercise - 06/10/19 0931      Exercises   Exercises  Knee/Hip      Knee/Hip Exercises: Aerobic   Recumbent Bike  L2 x 7 min      Knee/Hip Exercises: Standing    Hip Flexion  Right;Left;10 reps;Knee straight;Stengthening    Hip Flexion Limitations  green TB, 1 pole A for balance    Forward Lunges  Right;Left;10 reps;3 seconds;2 sets    Forward Lunges Limitations  TRX    Side Lunges  Right;Left;10 reps;3 seconds    Side Lunges Limitations  TRX    Hip ADduction  Right;Left;10 reps;Strengthening    Hip ADduction Limitations  green TB, 1 pole A for balance    Hip Abduction  Right;Left;10 reps;Knee straight;Stengthening    Abduction Limitations  green TB, 1 pole A for balance    Hip Extension  Right;Left;10 reps;Knee straight;Stengthening    Extension Limitations  green TB, 1 pole A for balance    Lateral Step Up  Right;Left;10 reps;Hand Hold: 0;2 sets   9" step   Lateral Step Up Limitations  2nd set - crossover step-up      Knee/Hip Exercises: Sidelying   Clams  L clam (R sidelying) 2 x 10 reps - 1st set with green TB, 2nd set with blue TB    Other Sidelying Knee/Hip Exercises  Side plank from elbow to knee + L clam with green TB x 10  PT Education - 06/10/19 1013    Education Details  HEP update - clam progression to blue TB & clam in side plank with green TB, 4-way standing SLR with green TB, lateral lunges    Person(s) Educated  Patient    Methods  Explanation;Demonstration;Verbal cues;Handout    Comprehension  Verbalized understanding;Returned demonstration;Verbal cues required;Need further instruction          PT Long Term Goals - 05/21/19 0935      PT LONG TERM GOAL #1   Title  Patient will be independent with ongoing/advanced HEP    Status  On-going    Target Date  06/16/19      PT LONG TERM GOAL #2   Title  L hip AROM WNL w/o pain provocation    Status  On-going    Target Date  06/16/19      PT LONG TERM GOAL #3   Title  Overall L hip strength >/= 4+/5 with adductor strength at least 4/5 for improved stability    Status  On-going    Target Date  06/16/19      PT LONG TERM GOAL #4   Title  Patient to  report ability to perform ADLs, household and work-related tasks without increased pain    Status  On-going    Target Date  06/16/19            Plan - 06/10/19 0936    Clinical Impression Statement  Theron AristaPeter reporting L LE bruising and swelling nearly resolved with no pain recently and states he has been able to resume playing tennis cautiously without pain. HEP doing well although patient requiring clarification of color progression for theraband resistance - blue TB provided for side lying clam progression. Progressed 4-way hip strengthening with emphasis on medial/lateral strengthening and stability with good tolerance and no pain reported, therefore HEP updated according. Patient wishing to make the next visit his last until he follows up with the MD on 06/30/19, although given current progress, expect that patient should be ready for discharge with transition to HEP as of next visit pending goal assessment.    Rehab Potential  Good    PT Frequency  2x / week    PT Duration  4 weeks    PT Treatment/Interventions  ADLs/Self Care Home Management;Cryotherapy;Electrical Stimulation;Iontophoresis 4mg /ml Dexamethasone;Moist Heat;Ultrasound;Gait training;Stair training;Functional mobility training;Therapeutic activities;Therapeutic exercise;Balance training;Neuromuscular re-education;Patient/family education;Manual techniques;Scar mobilization;Passive range of motion;Dry needling;Taping;Vasopneumatic Device    PT Next Visit Plan  goal assessment/MD progress note - anticipate transition to HEP; review HEP(s) as indicated; gentle hip stretching and strengthening; manual therapy as indicated; modalities PRN    PT Home Exercise Plan  05/19/19 - HS, ITB, piriformis, SKTC & hip adductor stretches; 05/21/19 - gentle hip adductor isometric, bent knee fall-out & bridge + hip adduction isometric, instruction in self-STM/release for L hip adductors & flexors;  05/29/19 - side stepping with red TB at forefoot, clams  shell with red band;  06/10/19 - clam progression to blue TB & clam in side plank with green TB, 4-way standing SLR with green TB, lateral lunges    Consulted and Agree with Plan of Care  Patient       Patient will benefit from skilled therapeutic intervention in order to improve the following deficits and impairments:  Abnormal gait, Decreased activity tolerance, Decreased endurance, Decreased mobility, Decreased range of motion, Decreased strength, Difficulty walking, Increased edema, Increased muscle spasms, Impaired perceived functional ability, Impaired flexibility, Pain  Visit Diagnosis: Pain in left  thigh  Muscle weakness (generalized)  Other muscle spasm  Difficulty in walking, not elsewhere classified     Problem List There are no problems to display for this patient.   Marry Guan, PT, MPT 06/10/2019, 10:29 AM  [facility] [address]  [address] [place], [place], [address] Phone: [phone]   Fax:  [phone]  Name: Brydon Spahr MRN: [id number] Date of Birth: 1971-01-10

## 2019-06-10 NOTE — Patient Instructions (Signed)
    Home exercise program created by Lavone Barrientes, PT.  For questions, please contact Lindzy Rupert via phone at [phone] or email at [email or web address]  [facility] [place] [address]  [address] [place], [place], [address] Phone: [phone]   Fax:  [phone]    

## 2019-06-12 ENCOUNTER — Other Ambulatory Visit: Payer: Self-pay

## 2019-06-12 ENCOUNTER — Ambulatory Visit: Payer: Managed Care, Other (non HMO) | Admitting: Physical Therapy

## 2019-06-12 ENCOUNTER — Encounter: Payer: Self-pay | Admitting: Physical Therapy

## 2019-06-12 DIAGNOSIS — M79652 Pain in left thigh: Secondary | ICD-10-CM | POA: Diagnosis not present

## 2019-06-12 DIAGNOSIS — R262 Difficulty in walking, not elsewhere classified: Secondary | ICD-10-CM

## 2019-06-12 DIAGNOSIS — M62838 Other muscle spasm: Secondary | ICD-10-CM

## 2019-06-12 DIAGNOSIS — M6281 Muscle weakness (generalized): Secondary | ICD-10-CM

## 2019-06-12 NOTE — Patient Instructions (Signed)
    Home exercise program created by Jauna Raczynski, PT.  For questions, please contact Seraphim Trow via phone at [phone] or email at [email or web address]  [facility] [place] [address]  [address] [place], [place], [address] Phone: [phone]   Fax:  [phone]    

## 2019-06-12 NOTE — Therapy (Addendum)
[facility] [place] [address]  [place], [place], [address] Phone: [phone]   Fax:  [phone]  Physical Therapy Treatment / Discharge Summary  Patient Details  Name: Spencer Chambers MRN: [id number] Date of Birth: 07/23/1970 Referring Provider (PT): Anderson Malta, MD   Encounter Date: 06/12/2019  PT End of Session - 06/12/19 0934    Visit Number  6    Number of Visits  8    Date for PT Re-Evaluation  06/16/19    Authorization Type  [employer] - VL: 20  ($60 copay)    PT Start Time  0934    PT Stop Time  1003    PT Time Calculation (min)  29 min    Activity Tolerance  Patient tolerated treatment well    Behavior During Therapy  [facility] for tasks assessed/performed       History reviewed. No pertinent past medical history.  Past Surgical History:  Procedure Laterality Date  . APPENDECTOMY      There were no vitals filed for this visit.  Subjective Assessment - 06/12/19 0936    Subjective  Pt reporting he has not had any more trouble with his leg and feels ready to transition to the HEP today.    Diagnostic tests  L hip MRI 05/03/19: 1. 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 The adductor longus tendon is ruptured from its attachment site and is retracted into the upper thigh.  2. Probable partial tearing of the adductor brevis muscle from its femoral attachment site.  3. The hips are intact and there is no pelvic or femur fractures identified.    Patient Stated Goals  "get ROM back w/o pain"    Currently in Pain?  No/denies         [facility] PT Assessment - 06/12/19 0934      Assessment   Medical Diagnosis  L hip adductor injury    Referring Provider (PT)  G. Alphonzo Severance, MD    Onset Date/Surgical Date  05/03/19    Next MD Visit  06/29/18      Observation/Other Assessments   Focus on Therapeutic Outcomes  (FOTO)   Upper leg - 99% (1% limitation)      PROM   Overall PROM   Within functional limits for tasks performed      Strength   Right Hip Flexion  5/5    Right Hip Extension  5/5    Right Hip External Rotation   5/5    Right Hip Internal Rotation  5/5    Right Hip ABduction  5/5    Right Hip ADduction  5/5    Left Hip Flexion  5/5    Left Hip Extension  5/5    Left Hip External Rotation  5/5    Left Hip Internal Rotation  5/5    Left Hip ABduction  5/5    Left Hip ADduction  4+/5    Right Knee Flexion  5/5    Right Knee Extension  5/5    Left Knee Flexion  5/5    Left Knee Extension  5/5                   [facility] Adult PT Treatment/Exercise - 06/12/19 0934      Exercises   Exercises  Knee/Hip      Knee/Hip Exercises: Stretches   Piriformis Stretch  Right;30  seconds;3 reps    Piriformis Stretch Limitations  KTOS, figure 4 to chest & longsitting trunk rotation (seated versions also demonstrated)      Knee/Hip Exercises: Aerobic   Elliptical  L3 x 6 min             PT Education - 06/12/19 1003    Education Details  HEP update - piriformis stretches for R sciatica per pt request    Person(s) Educated  Patient    Methods  Explanation;Demonstration;Handout    Comprehension  Verbalized understanding;Returned demonstration          PT Long Term Goals - 06/12/19 0937      PT LONG TERM GOAL #1   Title  Patient will be independent with ongoing/advanced HEP    Status  Achieved      PT LONG TERM GOAL #2   Title  L hip AROM WNL w/o pain provocation    Status  Achieved      PT LONG TERM GOAL #3   Title  Overall L hip strength >/= 4+/5 with adductor strength at least 4/5 for improved stability    Status  Achieved      PT LONG TERM GOAL #4   Title  Patient to report ability to perform ADLs, household and work-related tasks without increased pain    Status  Achieved            Plan - 06/12/19 0938    Clinical Impression Statement  K[place]ena  reporting no recent issues with L LE and feels that he is mostly back to his normal everyday activities without limitation due to L LE - FOTO down to 1% limitation. L hip ROM now WNL without pain and L LE strength grossly 5/5 with exception of hip adduction at 4+/5 with no pain reported. He feels confident with his HEP, denying need for further review, and verbalizes understanding of continued self-progression at home. All goals met and patient ready to transition to HEP but would like to remain on hold for 30 days if further issues arise necessitating return to PT.    Rehab Potential  Good    PT Frequency  --    PT Duration  --    PT Treatment/Interventions  ADLs/Self Care Home Management;Cryotherapy;Electrical Stimulation;Iontophoresis 45m/ml Dexamethasone;Moist Heat;Ultrasound;Gait training;Stair training;Functional mobility training;Therapeutic activities;Therapeutic exercise;Balance training;Neuromuscular re-education;Patient/family education;Manual techniques;Scar mobilization;Passive range of motion;Dry needling;Taping;Vasopneumatic Device    PT Next Visit Plan  30-day hold    PT Home Exercise Plan  05/19/19 - HS, ITB, piriformis, SKTC & hip adductor stretches; 05/21/19 - gentle hip adductor isometric, bent knee fall-out & bridge + hip adduction isometric, instruction in self-STM/release for L hip adductors & flexors;  05/29/19 - side stepping with red TB at forefoot, clams shell with red band;  06/10/19 - clam progression to blue TB & clam in side plank with green TB, 4-way standing SLR with green TB, lateral lunges; 06/12/19 - piriformis stretches for R sciatica at pt request    Consulted and Agree with Plan of Care  Patient       Patient will benefit from skilled therapeutic intervention in order to improve the following deficits and impairments:  Abnormal gait, Decreased activity tolerance, Decreased endurance, Decreased mobility, Decreased range of motion, Decreased strength, Difficulty  walking, Increased edema, Increased muscle spasms, Impaired perceived functional ability, Impaired flexibility, Pain  Visit Diagnosis: Pain in left thigh  Muscle weakness (generalized)  Other muscle spasm  Difficulty in walking, not elsewhere classified  Problem List There are no problems to display for this patient.   Percival Spanish, PT, MPT 06/12/2019, 11:36 AM  [facility] [address]  [place], [place], [address] Phone: [phone]   Fax:  [phone]  Name: Kemet Nijjar MRN: [id number] Date of Birth: January 28, 1971   PHYSICAL THERAPY DISCHARGE SUMMARY  Visits from Start of Care: 6  Current functional level related to goals / functional outcomes:   Refer to above clinical impression for status as of last visit on 06/12/2019. Patient was placed on hold for 30 days and has not needed to return to PT, therefore will proceed with discharge from PT for this episode.   Remaining deficits:   As above.   Education / Equipment:   HEP  Plan: Patient agrees to discharge.  Patient goals were met. Patient is being discharged due to meeting the stated rehab goals.  ?????     Percival Spanish, PT, MPT 07/15/19, 2:59 PM  [facility] [address]  [place], [place], [address] Phone: [phone]   Fax:  [phone]

## 2019-06-30 ENCOUNTER — Ambulatory Visit: Payer: Managed Care, Other (non HMO) | Admitting: Orthopedic Surgery

## 2019-08-08 ENCOUNTER — Ambulatory Visit: Payer: Managed Care, Other (non HMO) | Attending: Internal Medicine

## 2019-08-08 DIAGNOSIS — Z23 Encounter for immunization: Secondary | ICD-10-CM | POA: Insufficient documentation

## 2019-08-08 NOTE — Progress Notes (Signed)
   Covid-19 Vaccination Clinic  Name:  Spencer Chambers    MRN: [id number] DOB: 1970/09/26  08/08/2019  Spencer Chambers was observed post Covid-19 immunization for 15 minutes without incidence. He was provided with Vaccine Information Sheet and instruction to access the V-Safe system.   Spencer Chambers was instructed to call 911 with any severe reactions post vaccine: Marland Kitchen Difficulty breathing  . Swelling of your face and throat  . A fast heartbeat  . A bad rash all over your body  . Dizziness and weakness    Immunizations Administered    Name Date Dose VIS Date Route   Pfizer COVID-19 Vaccine 08/08/2019 10:45 AM 0.3 mL 05/30/2019 Intramuscular   Manufacturer: [employer], [employer]   Lot: [id number]   NDC: [address]-0

## 2019-09-02 ENCOUNTER — Ambulatory Visit: Payer: Managed Care, Other (non HMO) | Attending: Internal Medicine

## 2019-09-02 DIAGNOSIS — Z23 Encounter for immunization: Secondary | ICD-10-CM

## 2019-09-02 NOTE — Progress Notes (Signed)
   Covid-19 Vaccination Clinic  Name:  Spencer Chambers    MRN: [id number] DOB: 1970-06-30  09/02/2019  [name] was observed post Covid-19 immunization for 30 minutes based on pre-vaccination screening without incident. He was provided with Vaccine Information Sheet and instruction to access the V-Safe system.   [name] was instructed to call 911 with any severe reactions post vaccine: Marland Kitchen Difficulty breathing  . Swelling of face and throat  . A fast heartbeat  . A bad rash all over body  . Dizziness and weakness   Immunizations Administered    Name Date Dose VIS Date Route   Pfizer COVID-19 Vaccine 09/02/2019  8:32 AM 0.3 mL 05/30/2019 Intramuscular   Manufacturer: [employer], [employer]   Lot: [id number]   NDC: [address]-8

## 2020-07-02 IMAGING — MR MR HIP*L* W/O CM
8 series · 40 of 40 positions shown · non-contrast
Comparison: None.

CLINICAL DATA: Twisting injury playing tennis. Upper left thigh
pain.

EXAM:
MR OF THE LEFT HIP WITHOUT CONTRAST
TECHNIQUE: Multiplanar, multisequence MR imaging was performed. No intravenous
contrast was administered.

[Series 3: T1 · coronal · 4.0mm · 1.41mm/px · 5 of 34 slices shown (1 of 4)]
[im 1/34]
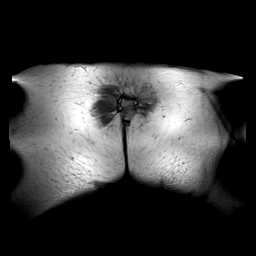
[im 9/34]
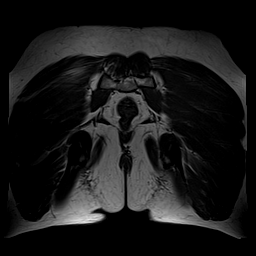
[im 17/34]
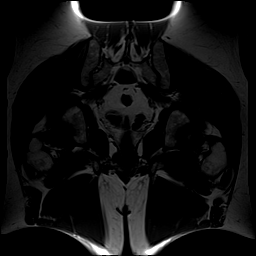
[im 25/34]
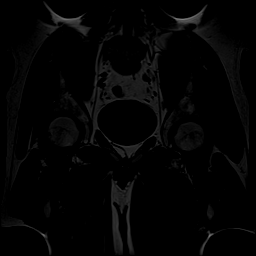
[im 34/34]
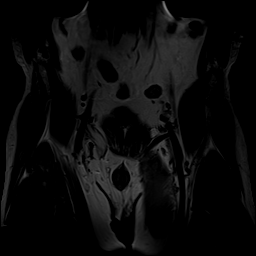

[Series 4: STIR · coronal · 4.0mm · 1.41mm/px · 5 of 34 slices shown (1 of 3)]
[im 1/34]
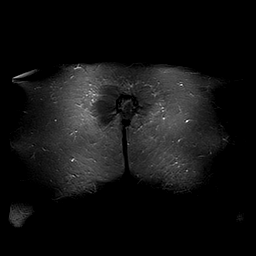
[im 9/34]
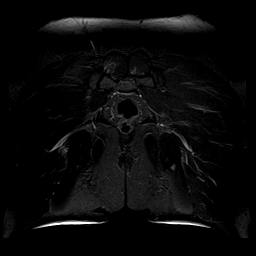
[im 17/34]
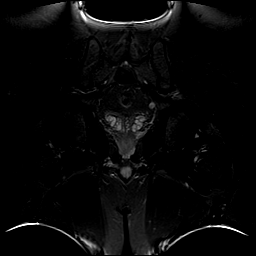
[im 25/34]
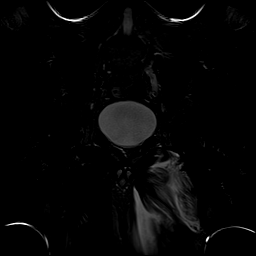
[im 34/34]
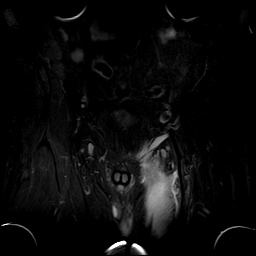

[Series 8: STIR · coronal · 4.0mm · 1.17mm/px · 5 of 42 slices shown (2 of 3)]
[im 1/42]
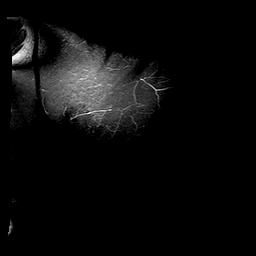
[im 11/42]
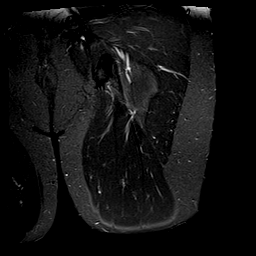
[im 21/42]
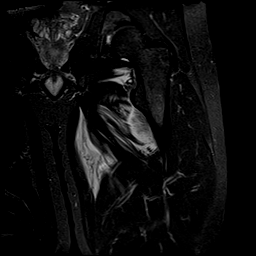
[im 31/42]
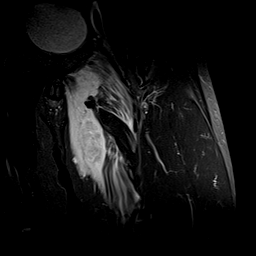
[im 42/42]
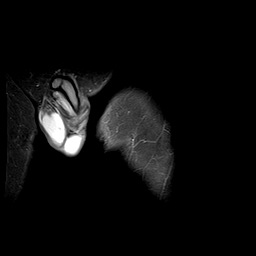

[Series 9: T2 fat-sat · axial · 5.0mm · 0.98mm/px · z∈[-277,+5]mm · 6 of 48 slices shown]
[im 1/48]
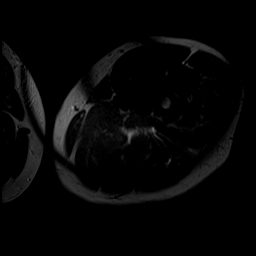
[im 10/48]
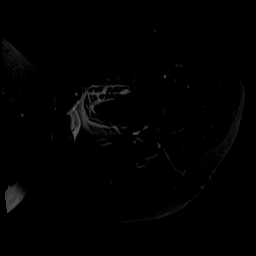
[im 19/48]
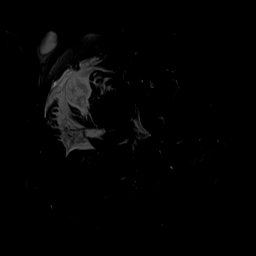
[im 29/48]
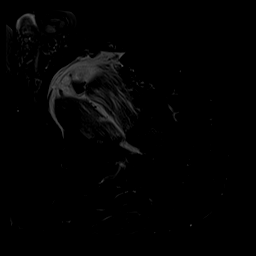
[im 38/48]
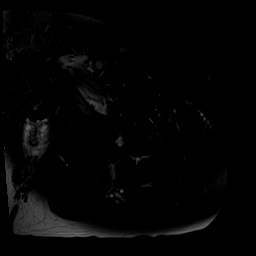
[im 48/48]
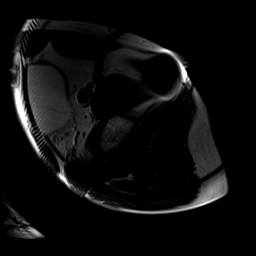

[Series 10: T1 · axial · 5.0mm · 1.02mm/px · z∈[-277,+4]mm · 6 of 48 slices shown (2 of 4)]
[im 1/48]
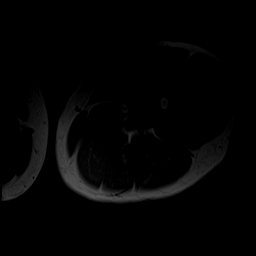
[im 10/48]
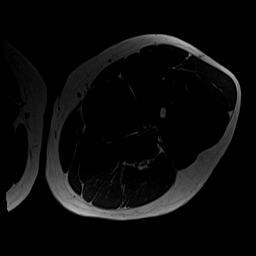
[im 19/48]
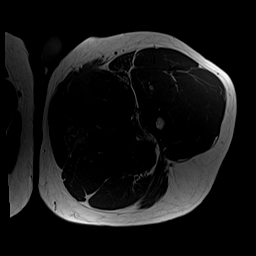
[im 29/48]
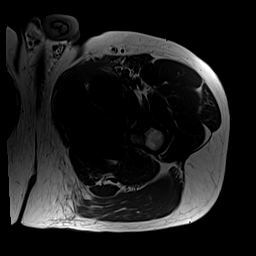
[im 38/48]
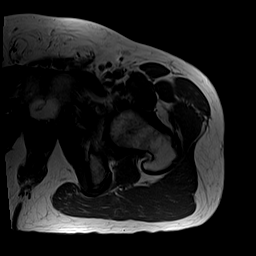
[im 48/48]
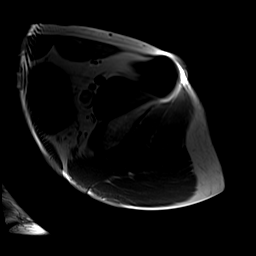

[Series 11: T1 · coronal · 4.0mm · 1.17mm/px · 5 of 42 slices shown (3 of 4)]
[im 1/42]
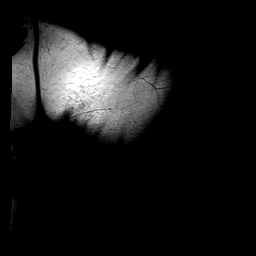
[im 11/42]
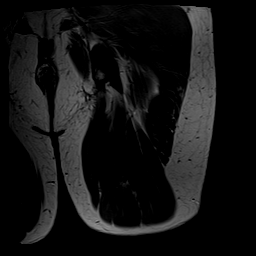
[im 21/42]
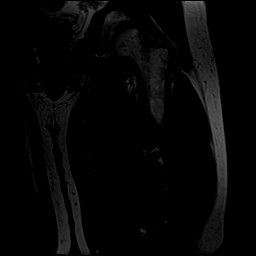
[im 31/42]
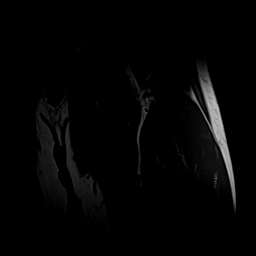
[im 42/42]
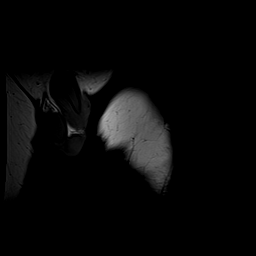

[Series 12: STIR · sagittal · 5.0mm · 1.17mm/px · 4 of 36 slices shown (3 of 3)]
[im 1/36]
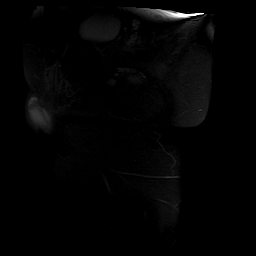
[im 12/36]
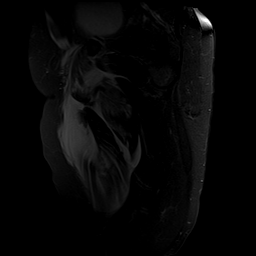
[im 24/36]
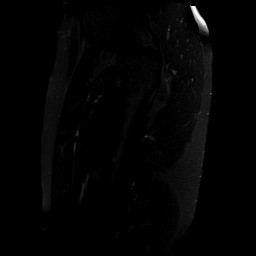
[im 36/36]
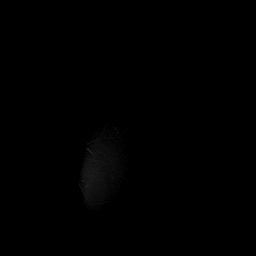

[Series 13: T1 · sagittal · 5.0mm · 1.17mm/px · 4 of 36 slices shown (4 of 4)]
[im 1/36]
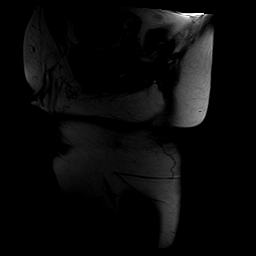
[im 12/36]
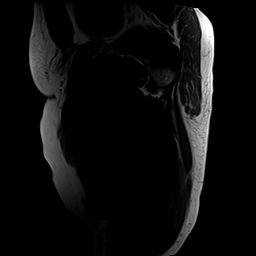
[im 24/36]
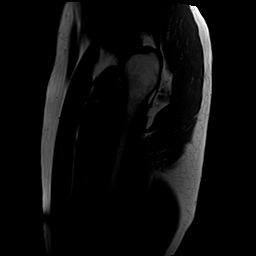
[im 36/36]
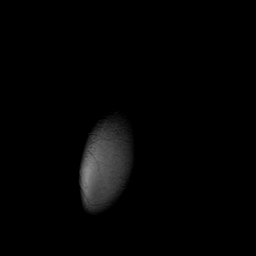

[40 of 40 positions shown; findings below may reference images not displayed]

FINDINGS: Both hips are normally located. No hip fracture or AVN. No joint
effusion. The pubic symphysis and SI joints are intact. No pelvic
fractures or bone lesions.

Extensive adductor muscle injuries. High-grade tearing of the
pectineus and obturator externus muscles. The adductor longus tendon
is completely torn from its attachment site and is retracted into
the upper thigh. Diffuse edema in the muscle. The adductor brevis is
also torn and may be partially stripped from its attachment site on
the femur. Associated significant fluid/edema and hemorrhage
extending down the medial aspect of the left thigh.

No femur fracture.
IMPRESSION: 1. Extensive adductor compartment muscle injury with high-grade
tearing of the pectineus, obturator externus, adductor longus and
adductor brevis muscles. The adductor longus tendon is ruptured from
its attachment site and is retracted into the upper thigh.
2. Probable partial tearing of the adductor brevis muscle from its
femoral attachment site.
3. The hips are intact and there is no pelvic or femur fractures
identified.

## 2020-10-29 ENCOUNTER — Other Ambulatory Visit: Payer: Self-pay

## 2020-10-29 ENCOUNTER — Ambulatory Visit (INDEPENDENT_AMBULATORY_CARE_PROVIDER_SITE_OTHER): Payer: Managed Care, Other (non HMO) | Admitting: Otolaryngology

## 2020-10-29 ENCOUNTER — Encounter (INDEPENDENT_AMBULATORY_CARE_PROVIDER_SITE_OTHER): Payer: Self-pay | Admitting: Otolaryngology

## 2020-10-29 VITALS — Temp 97.9°F

## 2020-10-29 DIAGNOSIS — M26609 Unspecified temporomandibular joint disorder, unspecified side: Secondary | ICD-10-CM

## 2020-10-29 DIAGNOSIS — J310 Chronic rhinitis: Secondary | ICD-10-CM | POA: Diagnosis not present

## 2020-10-29 DIAGNOSIS — H9201 Otalgia, right ear: Secondary | ICD-10-CM | POA: Diagnosis not present

## 2020-10-29 NOTE — Progress Notes (Signed)
HPI: Spencer Chambers is a 50 y.o. male who presents for evaluation of right ear pain and jaw pain has had for about a month now.  He was seen by his PCP and treated with a round of antibiotics.  He still has some pain in the right jaw area and in the right ear that radiates down to the right neck.  This initially started when allergies were bad about 6 weeks ago.  He uses Flonase on a regular basis. He is having no trouble swallowing.  No hoarseness.  No past medical history on file. Past Surgical History:  Procedure Laterality Date  . APPENDECTOMY     Social History   Socioeconomic History  . Marital status: Married    Spouse name: Not on file  . Number of children: Not on file  . Years of education: Not on file  . Highest education level: Not on file  Occupational History  . Not on file  Tobacco Use  . Smoking status: Never Smoker  . Smokeless tobacco: Never Used  Vaping Use  . Vaping Use: Former  Substance and Sexual Activity  . Alcohol use: Yes    Comment: occ  . Drug use: Never  . Sexual activity: Yes  Other Topics Concern  . Not on file  Social History Narrative  . Not on file   Social Determinants of Health   Financial Resource Strain: Not on file  Food Insecurity: Not on file  Transportation Needs: Not on file  Physical Activity: Not on file  Stress: Not on file  Social Connections: Not on file   No family history on file. Allergies  Allergen Reactions  . Shellfish Allergy Anaphylaxis   Prior to Admission medications   Medication Sig Start Date End Date Taking? Authorizing Provider  HYDROcodone-acetaminophen (NORCO/VICODIN) 5-325 MG tablet Take 1 tablet by mouth every 6 (six) hours as needed for severe pain. 05/03/19   Gerhard Munch, MD  tiZANidine (ZANAFLEX) 4 MG capsule Take 1 capsule (4 mg total) by mouth 3 (three) times daily. 05/03/19   Gerhard Munch, MD  traZODone (DESYREL) 150 MG tablet Take 150 mg by mouth at bedtime.    [provider]     Positive ROS: Otherwise negative  All other systems have been reviewed and were otherwise negative with the exception of those mentioned in the HPI and as above.  Physical Exam: Constitutional: Alert, well-appearing, no acute distress Ears: External ears without lesions or tenderness. Ear canals are clear bilaterally.  TMs are clear bilaterally specifically the right TM is clear with good mobility on pneumatic otoscopy.  Hearing was normal in both ears with AC > BC bilaterally. Nasal: External nose without lesions. Septum slightly deviated to the right..  Following decongestant spray nasal endoscopy was performed and on nasal endoscopy he deftly has some edema within the nasal mucosa in the right middle meatus.  But I was able to pass the scope within the middle meatus and there is no gross mucopurulent discharge.  The only mucus noted in the nasal cavity was clear.  The nasopharynx was clear as was the eustachian tube regions. Oral: Lips and gums without lesions. Tongue and palate mucosa without lesions. Posterior oropharynx clear.  Tonsils are benign in appearance bilaterally with no swelling.  Indirect laryngoscopy revealed a clear base of tongue vallecula epiglottis and vocal cords were clear. Neck: No palpable adenopathy or masses.  No swelling over the parotid area.  No adenopathy in the neck.  He seems to  have pain in the region of the right TMJ joint.  But there is no erythema or swelling. Respiratory: Breathing comfortably  Skin: No facial/neck lesions or rash noted.  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 Chronic rhinitis with history of allergies.  Plan: Recommended continue use of the Flonase 2 sprays each nostril at night. 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  If problems persist would recommend follow-up  with his dentist.  Narda Bonds, MD

## 2024-05-15 ENCOUNTER — Other Ambulatory Visit: Payer: Self-pay | Admitting: Medical Genetics
